# Patient Record
Sex: Male | Born: 1985 | Race: White | Hispanic: No | Marital: Married | Smoking: Current every day smoker
Health system: Southern US, Community
[De-identification: ages and names within clinical notes are randomized; demographics above are authoritative.]

## PROBLEM LIST (undated history)

## (undated) HISTORY — PX: TONSILLECTOMY: SUR1361

---

## 2009-07-28 ENCOUNTER — Ambulatory Visit: Payer: Self-pay | Admitting: Unknown Physician Specialty

## 2013-12-20 ENCOUNTER — Ambulatory Visit: Payer: Self-pay | Admitting: Family Medicine

## 2013-12-20 ENCOUNTER — Inpatient Hospital Stay: Payer: Self-pay | Admitting: Internal Medicine

## 2013-12-20 LAB — CBC WITH DIFFERENTIAL/PLATELET
Basophil #: 0.2 10*3/uL — ABNORMAL HIGH (ref 0.0–0.1)
Basophil %: 1.3 %
EOS ABS: 0.1 10*3/uL (ref 0.0–0.7)
Eosinophil %: 0.9 %
HCT: 46.3 % (ref 40.0–52.0)
HGB: 15.8 g/dL (ref 13.0–18.0)
LYMPHS ABS: 1.8 10*3/uL (ref 1.0–3.6)
LYMPHS PCT: 13.2 %
MCH: 30 pg (ref 26.0–34.0)
MCHC: 34.2 g/dL (ref 32.0–36.0)
MCV: 88 fL (ref 80–100)
MONO ABS: 0.6 x10 3/mm (ref 0.2–1.0)
Monocyte %: 4.1 %
NEUTROS ABS: 11.1 10*3/uL — AB (ref 1.4–6.5)
NEUTROS PCT: 80.5 %
PLATELETS: 189 10*3/uL (ref 150–440)
RBC: 5.28 10*6/uL (ref 4.40–5.90)
RDW: 13.4 % (ref 11.5–14.5)
WBC: 13.8 10*3/uL — ABNORMAL HIGH (ref 3.8–10.6)

## 2013-12-20 LAB — BASIC METABOLIC PANEL
Anion Gap: 14 (ref 7–16)
BUN: 9 mg/dL (ref 7–18)
CALCIUM: 9.1 mg/dL (ref 8.5–10.1)
CO2: 22 mmol/L (ref 21–32)
CREATININE: 1.09 mg/dL (ref 0.60–1.30)
Chloride: 100 mmol/L (ref 98–107)
EGFR (Non-African Amer.): >60
Glucose: 143 mg/dL — ABNORMAL HIGH (ref 65–99)
Osmolality: 273 (ref 275–301)
Potassium: 3.5 mmol/L (ref 3.5–5.1)
SODIUM: 136 mmol/L (ref 136–145)

## 2013-12-20 LAB — RAPID INFLUENZA A&B ANTIGENS (ARMC ONLY)

## 2013-12-21 LAB — BASIC METABOLIC PANEL
Anion Gap: 5 — ABNORMAL LOW (ref 7–16)
BUN: 8 mg/dL (ref 7–18)
CO2: 23 mmol/L (ref 21–32)
CREATININE: 0.74 mg/dL (ref 0.60–1.30)
Calcium, Total: 9.3 mg/dL (ref 8.5–10.1)
Chloride: 107 mmol/L (ref 98–107)
EGFR (Non-African Amer.): >60
GLUCOSE: 171 mg/dL — AB (ref 65–99)
Osmolality: 272 (ref 275–301)
POTASSIUM: 4.3 mmol/L (ref 3.5–5.1)
Sodium: 135 mmol/L — ABNORMAL LOW (ref 136–145)

## 2013-12-21 LAB — CBC WITH DIFFERENTIAL/PLATELET
BASOS ABS: 0.1 10*3/uL (ref 0.0–0.1)
Basophil %: 0.4 %
Eosinophil #: 0 10*3/uL (ref 0.0–0.7)
Eosinophil %: 0.1 %
HCT: 43 % (ref 40.0–52.0)
HGB: 14.6 g/dL (ref 13.0–18.0)
Lymphocyte #: 1.3 10*3/uL (ref 1.0–3.6)
Lymphocyte %: 10.6 %
MCH: 30.1 pg (ref 26.0–34.0)
MCHC: 34 g/dL (ref 32.0–36.0)
MCV: 89 fL (ref 80–100)
MONO ABS: 0.2 x10 3/mm (ref 0.2–1.0)
MONOS PCT: 1.6 %
Neutrophil #: 10.7 10*3/uL — ABNORMAL HIGH (ref 1.4–6.5)
Neutrophil %: 87.3 %
Platelet: 174 10*3/uL (ref 150–440)
RBC: 4.85 10*6/uL (ref 4.40–5.90)
RDW: 13.2 % (ref 11.5–14.5)
WBC: 12.2 10*3/uL — ABNORMAL HIGH (ref 3.8–10.6)

## 2013-12-25 LAB — CULTURE, BLOOD (SINGLE)

## 2015-03-05 NOTE — Discharge Summary (Signed)
PATIENT NAME:  Stanley Herrera, Stanley Herrera MR#:  [id number] DATE OF BIRTH:  August 21, 1986  DATE OF ADMISSION:  12/20/2013 DATE OF DISCHARGE:  12/21/2013  DISCHARGE DIAGNOSES: 1.  Acute respiratory failure. 2.  Chronic obstructive pulmonary disease exacerbation. 3.  Right upper lobe pneumonia with indistinct nodularity. Need to be followed by CT chest in 6 weeks. 4.  Active smoker.   CONDITION ON DISCHARGE: Stable.   CODE STATUS: FULL code.   MEDICATIONS ON DISCHARGE: 1.  Prednisone 10 mg tablet start at 60 mg daily and taper by 10 mg daily until complete.  2.  Advair 1 puff inhalation 2 times a day.  3.  Levaquin 500 mg oral tablet every 24 hours for 5 days.  4.  Chlorpheniramine and hydrocodone liquid 5 mL every 12 hours for 5 days as needed for cough.   DIET ON DISCHARGE: Regular. Diet consistency: Regular.   TIMEFRAME TO FOLLOW-UP: Within 4 to 6 weeks. Advised to follow in 6 weeks to have a repeat chest CT to compare.  HISTORY OF PRESENTING ILLNESS: A 29 year old male with history of tobacco abuse and past medication history of none who presented to Emergency Room with 4 to 5 days of progressive shortness of breath and cough and fever. In the Emergency Room, he was noted to be extremely tachypneic and labored breathing, was tachycardic and hypoxic also, and was given IV antibiotics and admitted to hospitalist service for further management.   HOSPITAL COURSE AND STAY: The patient was a chronic smoker, approximately 2 packs per day, but then now cutting down and was down at 1/2 pack per day. Was feeling much better on the next day after being treated by antibiotics, IV steroid, and nebulizer treatment. His acute respiratory failure, which was present on admission, requiring oxygen, he came off the next day home oxygen and was comfortable on room air. COPD exacerbation and pneumonia also responded very well within 1 day of treatment and so the patient was feeling fine, requested to let him go home, and he  would like to follow with a primary care for further issues, so we discharged him home.   OTHER MEDICAL ISSUES IN THIS HOSPITAL COURSE:  1.  There was finding of 2 cm indistinct nodule, worsening infiltrate, in right upper lobe on CT scan so we advised him to follow with a CT scan after 6 weeks, after clearing his pneumonia, with a primary care and he agreed with that.  2.  Active smoker. Was counseled for smoking cessation and he agreed to try nicotine inhaler.  IMPORTANT LABORATORY RESULTS IN THE HOSPITAL: On presentation, WBC was 13.8, hemoglobin was 15.8, and platelet count was 189. Influenza A and B were negative. Creatinine was 1.09, sodium 136, and potassium 3.5.   Chest x-ray, PA and lateral, was done which showed no acute cardiopulmonary disease. CT angiography was done to rule out pulmonary embolism, and it showed no evidence of acute pulmonary embolism, but ill-defined right upper lobe nodule with adjacent nodularity, most likely representing focal pneumonia. Followup CT advised in 4 to 6 weeks for clearance.   Blood culture was negative.  Chest x-ray, PA and lateral, on further followup showed bibasilar atelectasis.   TOTAL TIME SPENT ON THIS DISCHARGE: 40 minutes.   ____________________________ Hope PigeonVaibhavkumar G. Elisabeth PigeonVachhani, MD vgv:sb D: 01/05/2014 08:58:16 ET T: 01/05/2014 10:01:25 ET JOB#: [id number]  cc: Hope PigeonVaibhavkumar G. Elisabeth PigeonVachhani, MD, <Dictator> Duane LopeJeffrey D. Judithann SheenSparks, MD Altamese DillingVAIBHAVKUMAR Massiah Minjares MD ELECTRONICALLY SIGNED 01/10/2014 0:41

## 2015-03-05 NOTE — H&P (Signed)
PATIENT NAME:  Stanley ParadiseCOX, [name] MR#:  [id number] DATE OF BIRTH:  [birth year]  DATE OF ADMISSION:  12/20/2013  REFERRING PHYSICIAN: Dr. Cyril Herrera  FAMILY PHYSICIAN: None.   REASON FOR ADMISSION: Acute respiratory failure.   HISTORY OF PRESENT ILLNESS: The patient is a 29 year old male with a history of tobacco abuse, but other past medical history, who presents to the Emergency Room with a 4 to 5 day history of progressive shortness of breath, cough and fever. In the Emergency Room, the patient was noted to be extremely tachypneic, with labored breathing. He was also tachycardic and hypoxic. He was given fluids in the Emergency Room with IV antibiotics, with no improvement of his symptoms. He is now admitted for further evaluation.   PAST MEDICAL HISTORY: Tobacco abuse.   MEDICATIONS: None.   ALLERGIES: No known drug allergies.   SOCIAL HISTORY: The patient is married. He smokes 1/2 pack per day. No history of alcohol abuse. Works as a [employer] architect[employer]ruck driver.   FAMILY HISTORY: Positive for hypertension, but otherwise unremarkable.   REVIEW OF SYSTEMS:   CONSTITUTIONAL: The patient has had fever, but no change in weight.  EYES: No blurred or double vision. No glaucoma.  ENT: No tinnitus or hearing loss. No nasal discharge or bleeding. Some difficulty swallowing.  RESPIRATORY: The patient has had cough and wheezing. Denies hemoptysis.  CARDIOVASCULAR: No chest pain or orthopnea. Some palpitations. No syncope.  GASTROINTESTINAL: Nausea, but no vomiting or diarrhea. No abdominal pain.  GENITOURINARY: No dysuria or hematuria. No incontinence.  ENDOCRINE: No polyuria or polydipsia. No heat or cold intolerance.  HEMATOLOGIC: The patient denies anemia, easy bruising or bleeding.  LYMPHATIC: No swollen glands.  MUSCULOSKELETAL: The patient denies pain in his neck, back, shoulders, knees, or hips. No gout.  NEUROLOGIC: No numbness or migraines. Denies stroke or seizures.  PSYCHIATRIC: The patient denies anxiety,  insomnia or depression.   PHYSICAL EXAMINATION: GENERAL: The patient is acutely ill-appearing, in moderate respiratory distress.  VITAL SIGNS: Currently remarkable for a blood pressure of 126/77, heart rate of 132, respiratory rate of 22, temperature 100.2, sats 96% on oxygen.  HEENT: Normocephalic, atraumatic. Pupils equally round and reactive to light and accommodation. Extraocular movements are intact. Sclerae not icteric. Conjunctivae are clear.  Oropharynx is dry, but clear.  NECK: Supple, without JVD. No adenopathy or thyromegaly is noted.  LUNGS: Revealed expiratory wheezes with scattered rhonchi and dullness along the right mid lung field. Respiratory effort is increased.  CARDIAC: Exam reveals a rapid rate with a regular rhythm. Normal S1 and S2. No significant rubs, murmurs or gallops. PMI is nondisplaced. Chest wall is nontender.  ABDOMEN: Soft, nontender, with normoactive bowel sounds. No organomegaly or masses were appreciated. No hernias or bruits were noted.  EXTREMITIES: Without clubbing, cyanosis or edema. Pulses were 2+ bilaterally.  SKIN: Warm and dry, without rash or lesions.  NEUROLOGIC: Exam revealed cranial nerves II through XII grossly intact. Deep tendon reflexes were symmetric. Motor and sensory exams nonfocal.  PSYCHIATRIC: Exam revealed a patient who is alert and oriented to person, place, and time. He was cooperative and used good judgment.   LABORATORY DATA: EKG revealed sinus tachycardia with no acute ischemic changes. CT of the chest revealed a nodular infiltrate consistent with pneumonia in the right upper lobe. Influenza swab was negative. White count was 13.8, with a hemoglobin of 15.8. Glucose 143, with a BUN of 9, creatinine 1.09, with a sodium of 136, potassium of 3.5.   ASSESSMENT: 1.  Acute respiratory  failure.  2.  Chronic obstructive pulmonary disease exacerbation.  3.  Pneumonia.  4.  Tachycardia.   PLAN: The patient will be admitted to the floor as  a FULL CODE with oxygen, IV steroids, IV antibiotics, and DuoNeb SVNs. Will follow his sugars while on steroids. Will begin empiric Symbicort and Mucinex. Will also begin IV fluids to see if this improves his tachycardia. Followup chest x-ray and labs in the morning. Wean oxygen as tolerated. Will send sputum off for Gram stain and culture. Further treatment and evaluation will depend upon the patient's progress.   Total time spent on this patient was 45 minutes.    ____________________________ Stanley Herrera Judithann Sheen, MD jds:mr D: 12/20/2013 18:55:16 ET T: 12/20/2013 20:24:46 ET JOB#: [id number]  cc: Stanley Herrera. Judithann Sheen, MD, <Dictator> Stanley Tonkovich Rodena Medin MD ELECTRONICALLY SIGNED 12/21/2013 7:48

## 2015-10-08 IMAGING — CT CT ANGIO CHEST
2 of 6 series · 18 of 36 positions shown · IV contrast (APPLIED)
Comparison: DG CHEST 2V dated 12/20/2013

CLINICAL DATA: Long distance truck driver with shortness of breath.

EXAM:
CT ANGIOGRAPHY CHEST WITH CONTRAST
TECHNIQUE: Multidetector CT imaging of the chest was performed using the
standard protocol during bolus administration of intravenous
contrast. Multiplanar CT image reconstructions including MIPs were
obtained to evaluate the vascular anatomy.
CONTRAST:  100 ml Isovue 370.

[Series 5: pe 1.0 thins · axial · 0.78mm/px · z∈[-742,-466]mm · 17 of 311 slices shown]
[im 18/311  lung]
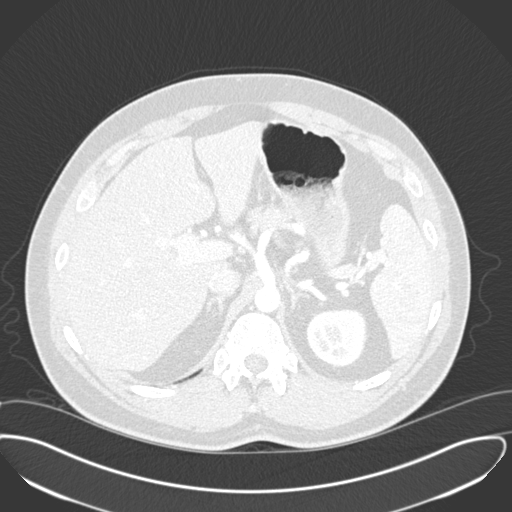
[im 35/311  mediastinal]
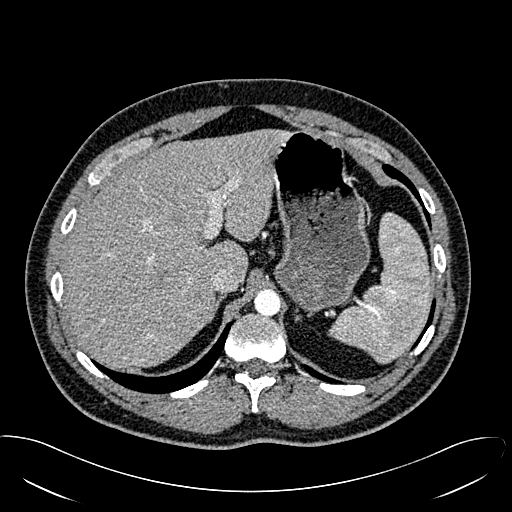
[im 52/311  lung]
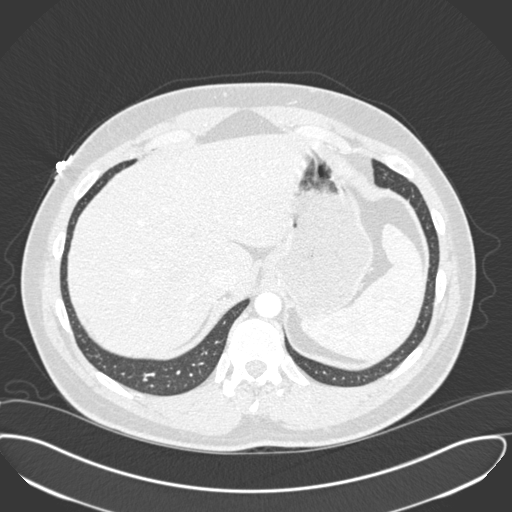
[im 69/311  mediastinal]
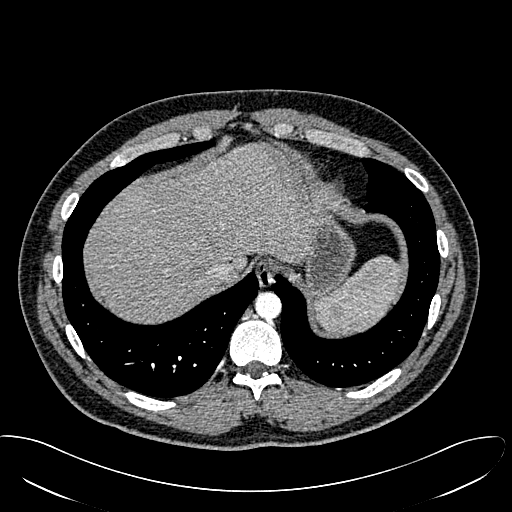
[im 87/311  lung]
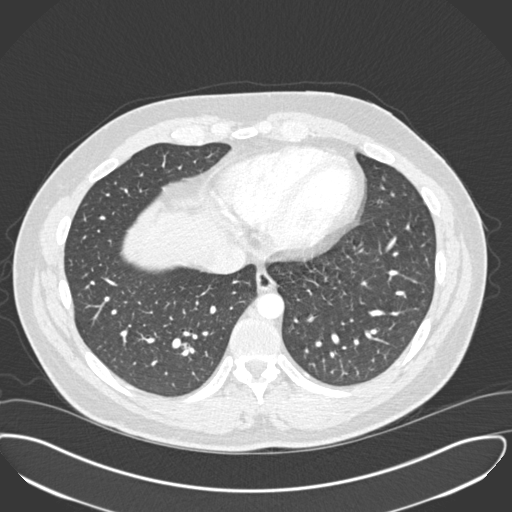
[im 104/311  mediastinal]
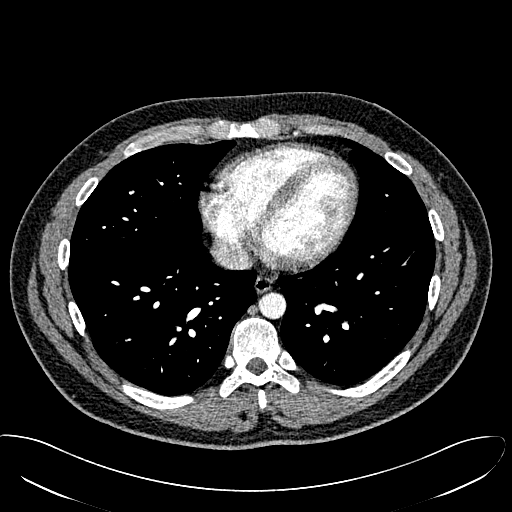
[im 121/311  lung]
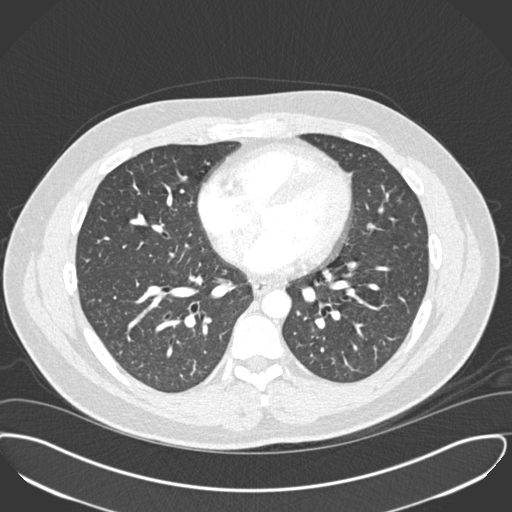
[im 138/311  mediastinal]
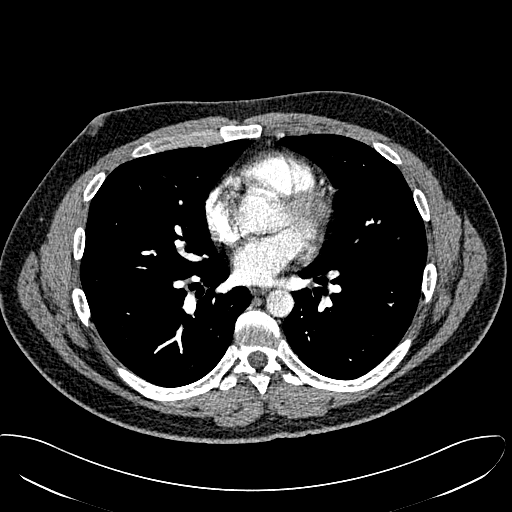
[im 156/311  lung]
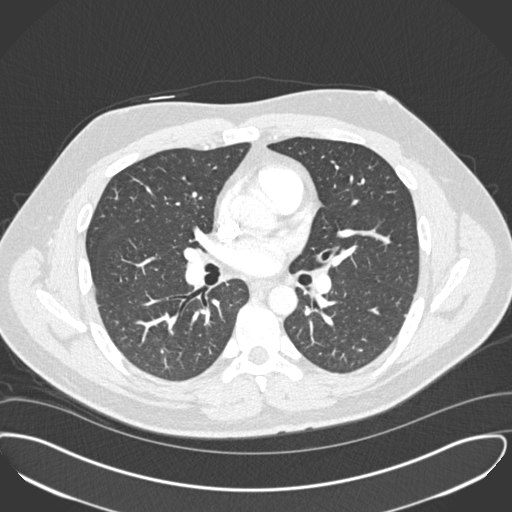
[im 173/311  mediastinal]
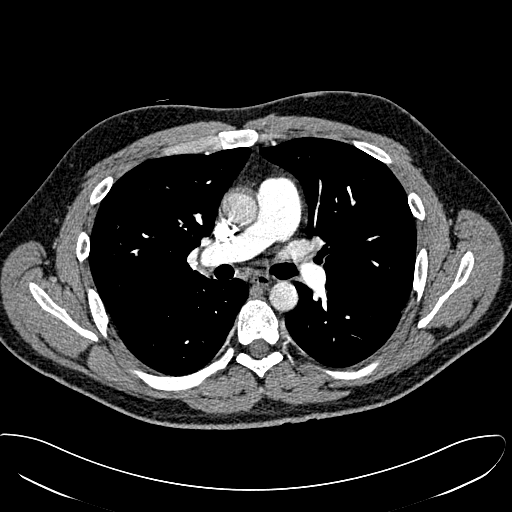
[im 190/311  lung]
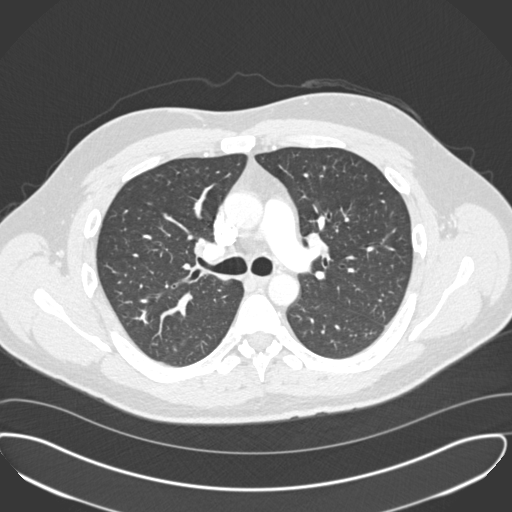
[im 207/311  mediastinal]
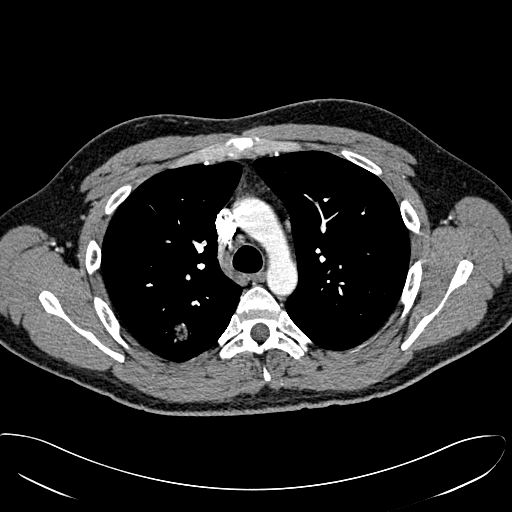
[im 224/311  lung]
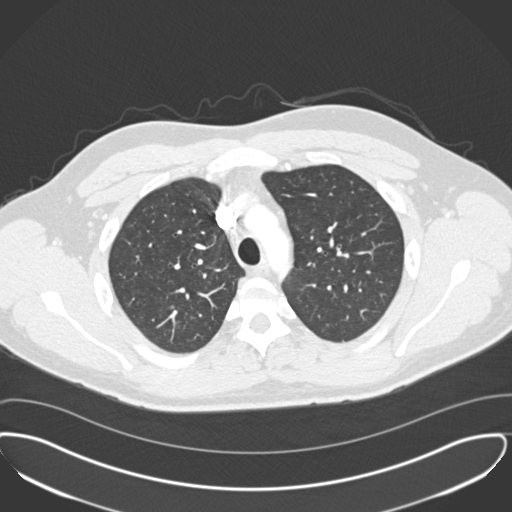
[im 242/311  mediastinal]
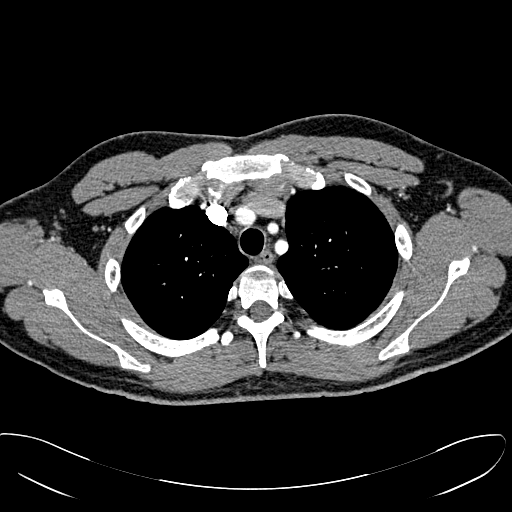
[im 259/311  lung]
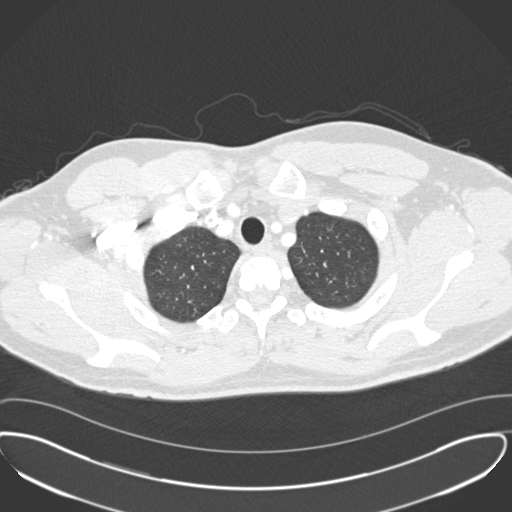
[im 276/311  mediastinal]
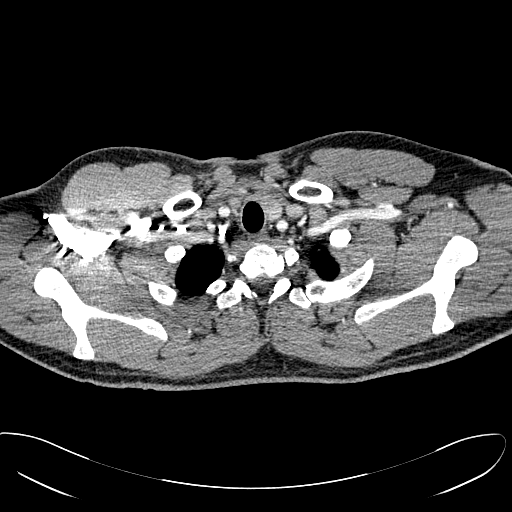
[im 293/311  lung]
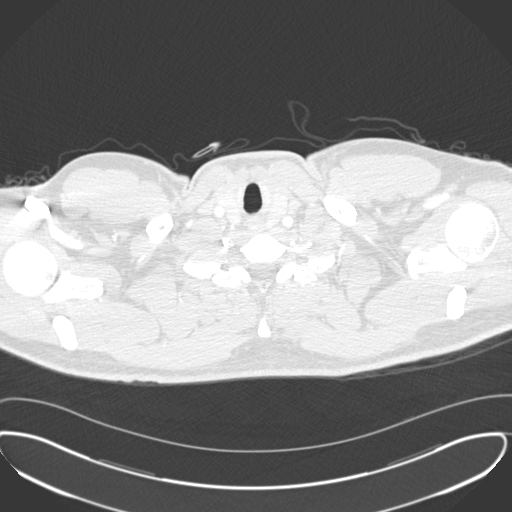

[Series 7: cor pe 2.0 mpr · coronal · 0.59mm/px · 1 of 143 slices shown]
[im 72/143  mediastinal]
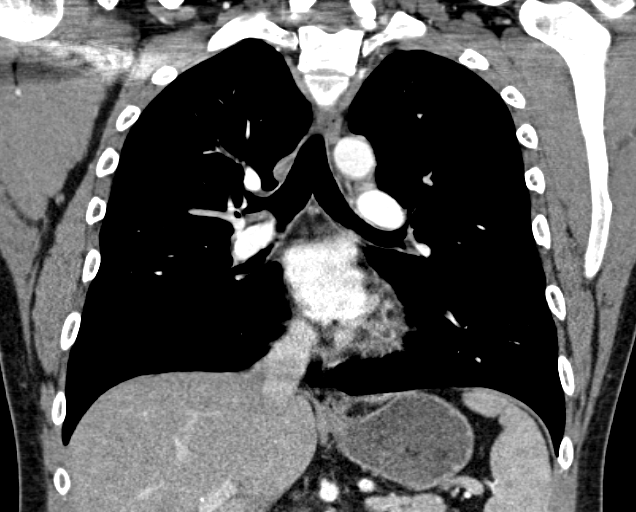

[18 of 36 positions shown; findings below may reference images not displayed]

FINDINGS: The pulmonary arteries are well opacified with contrast. There is no
evidence of acute pulmonary embolism.

There is no significant atherosclerosis. Small hilar lymph nodes
bilaterally are likely reactive. There is a 9 mm pretracheal node on
image 56. No pathologically enlarged mediastinal or hilar lymph
nodes are demonstrated. There is no pleural or pericardial effusion.

There is an irregular nodular density posteriorly in the right upper
lobe, measuring 2.5 cm on image 52. Although potentially an
irregular pulmonary nodule, this appears less distinct on the
reformatted images and is favored to reflect a focal infiltrate.
This is supported by the presence of additional areas of surrounding
ill-defined nodularity. The left lung is clear.

The visualized upper abdomen appears unremarkable. There are no
worrisome osseous findings.

Review of the MIP images confirms the above findings.
IMPRESSION: 1. No evidence of acute pulmonary embolism.
2. Somewhat ill-defined right upper lobe nodule with adjacent
nodularity most likely represents focal pneumonia in this
27-year-old. This is not well visualized radiographically.
Therefore, this may require limited CT follow-up in 4-6 weeks (after
appropriate therapy) to exclude an underlying neoplastic process.
3. Probable small reactive mediastinal and hilar lymph nodes. No
significant pleural effusion.

## 2015-10-09 IMAGING — CR DG CHEST 2V
1 series · 2 of 2 positions shown · non-contrast
Comparison: CT ANGIO CHEST dated 12/20/2013; DG CHEST 2V dated
12/20/2013

CLINICAL DATA: Pneumonia.

EXAM:
CHEST - 2 VIEW

[Series 1: w chest pa · 0.14mm/px · 2 of 2 slices shown]
[im 1/2]
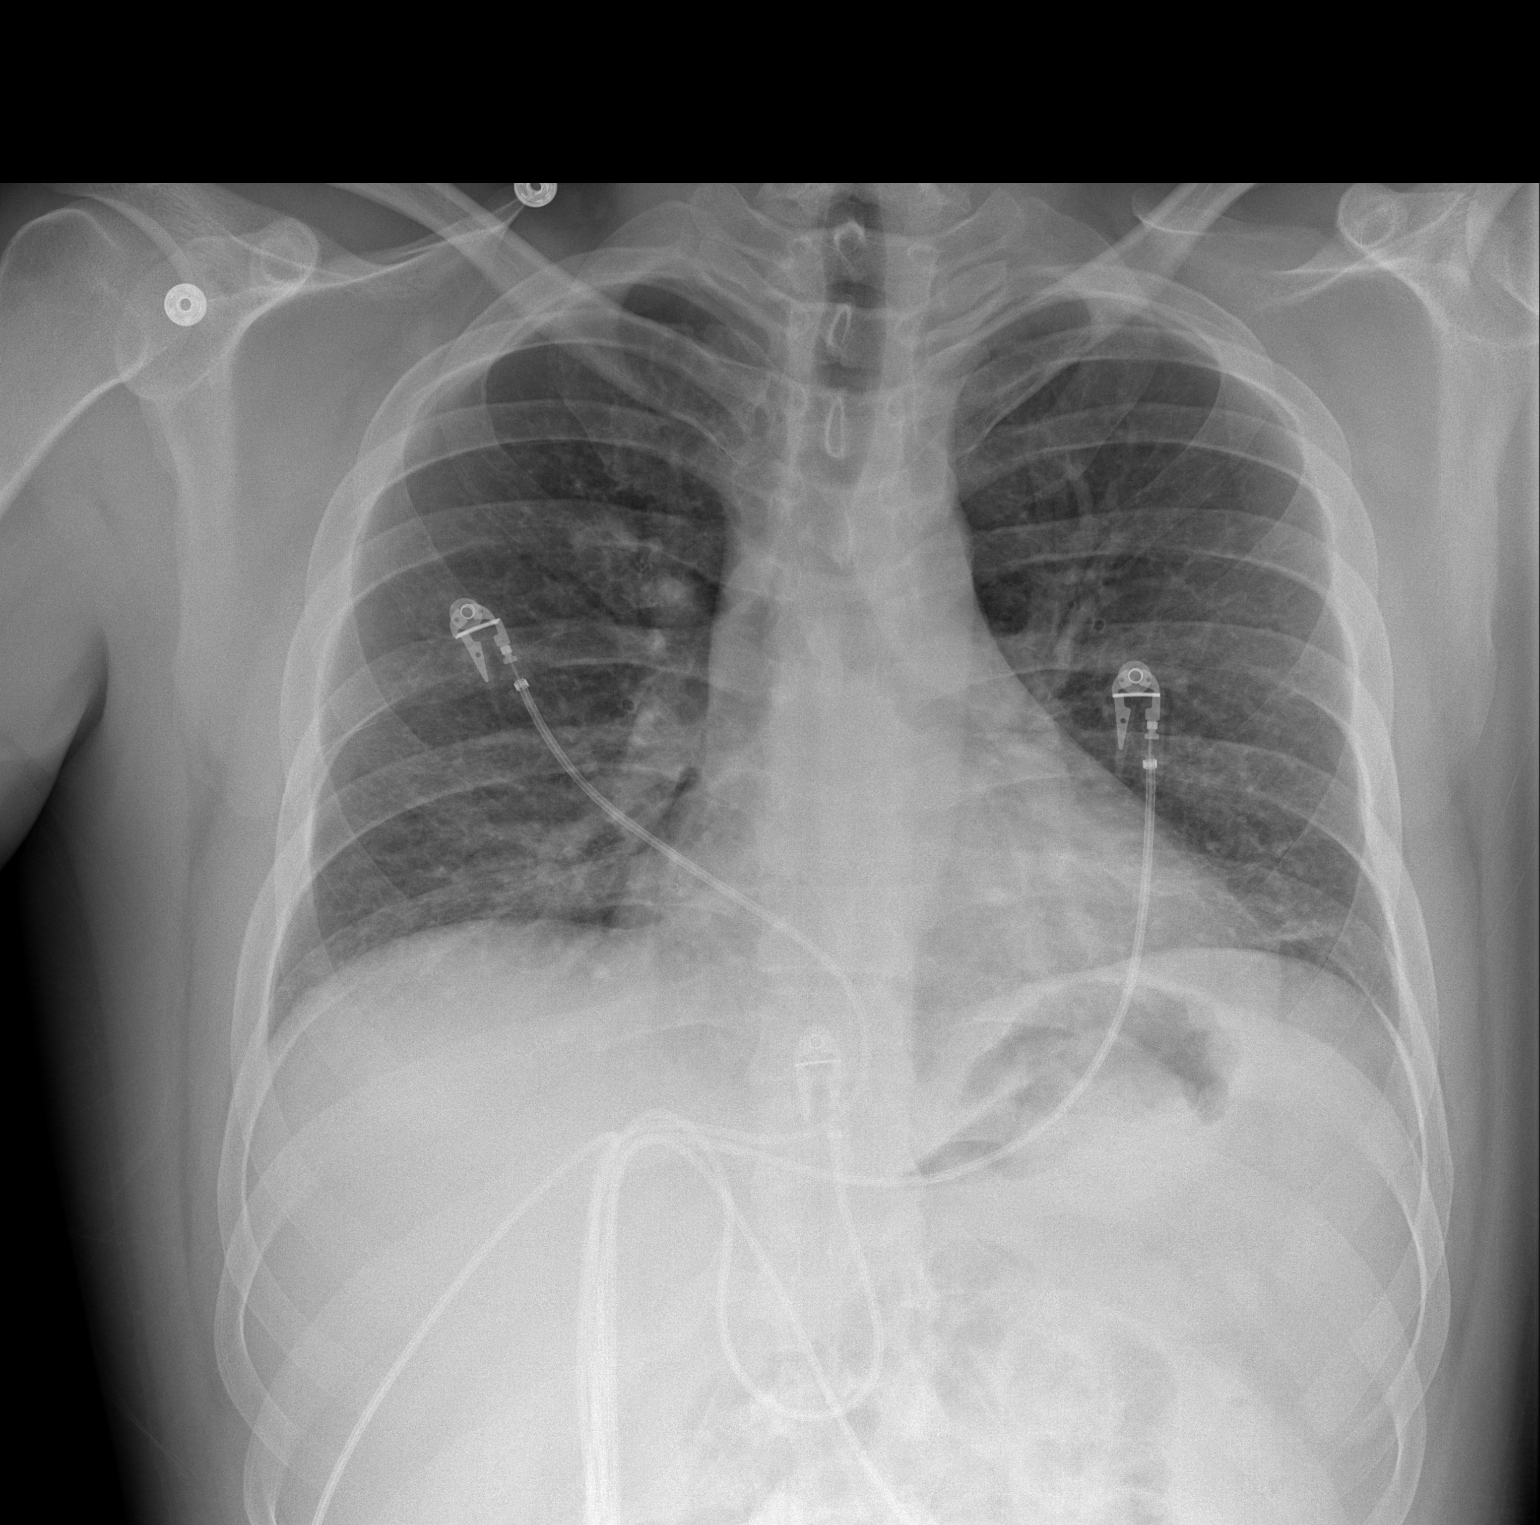
[im 2/2]
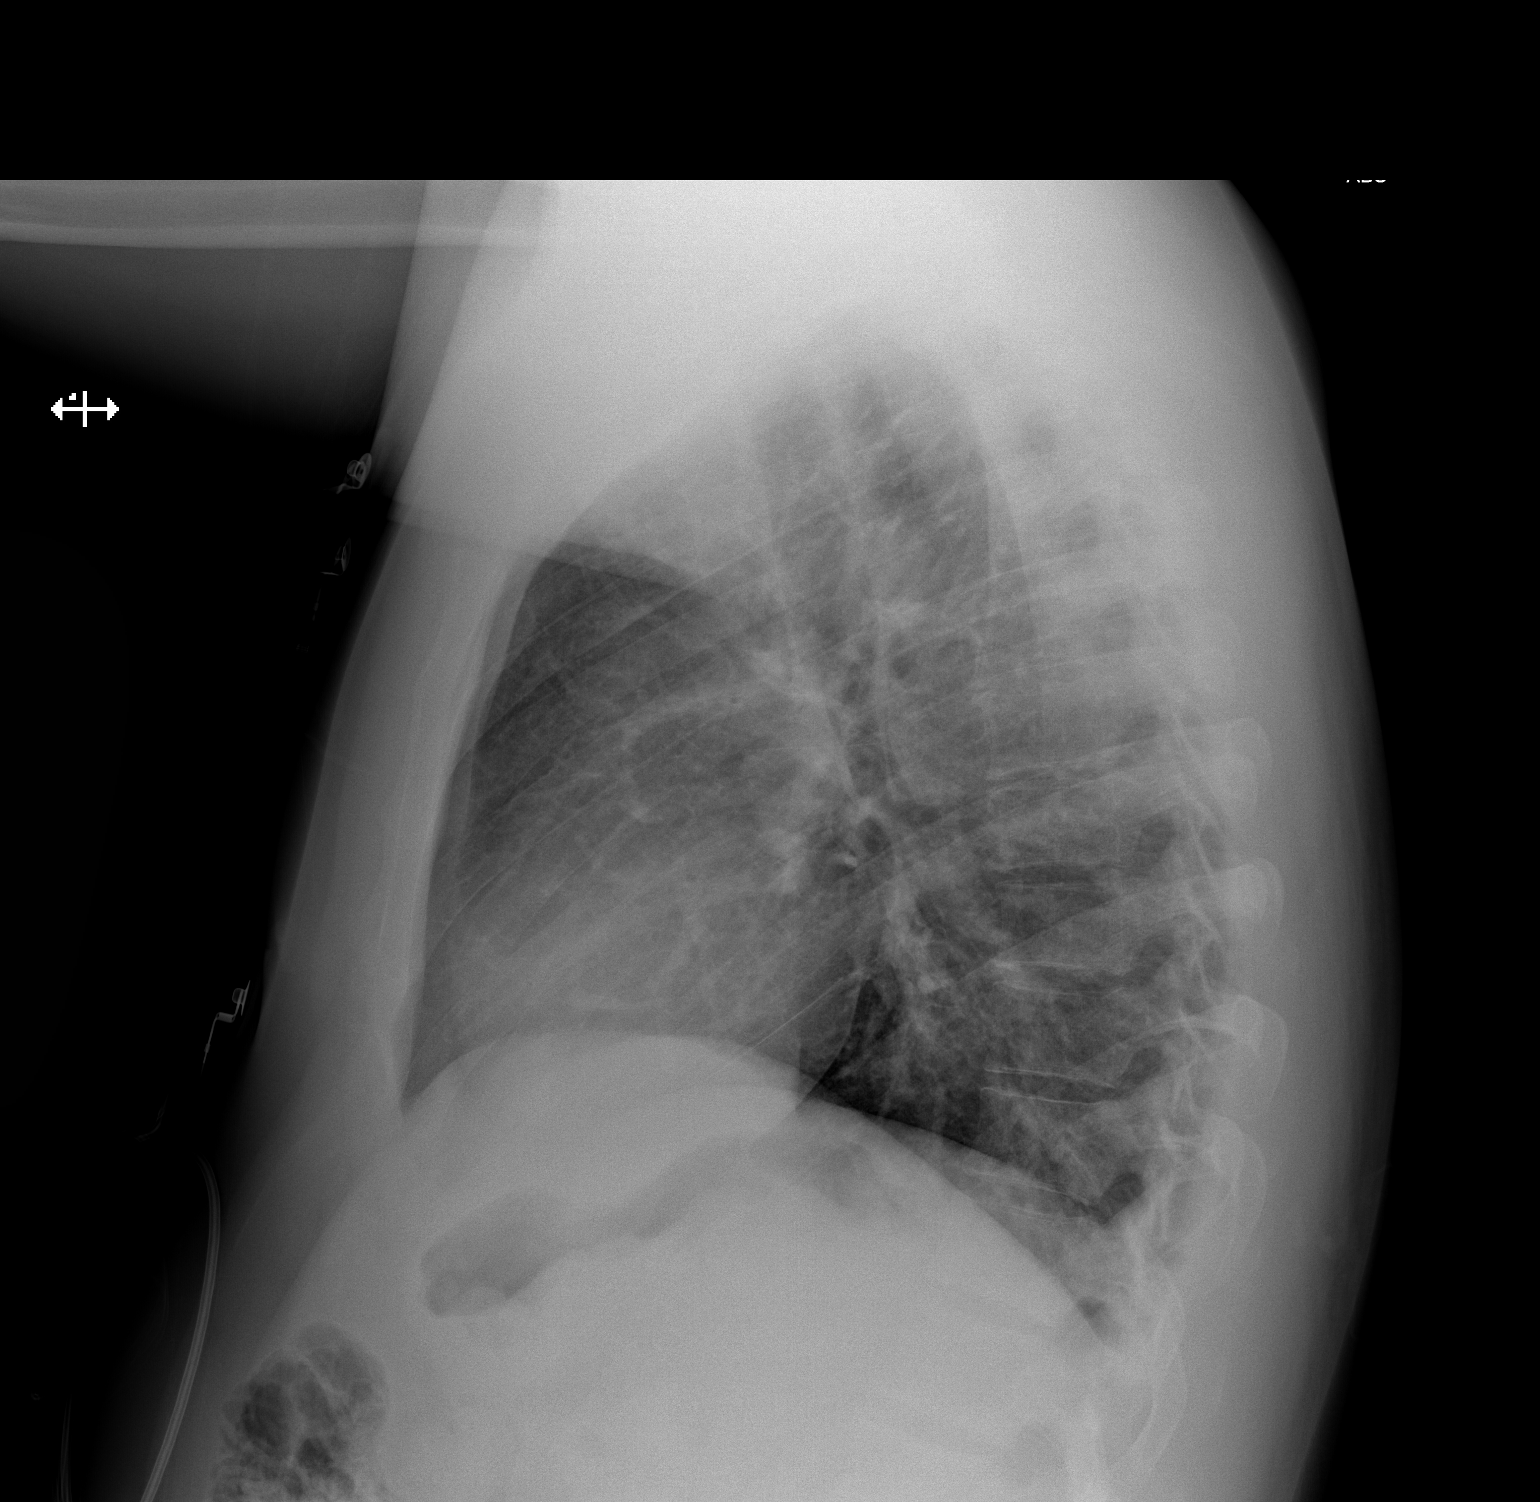

[2 of 2 positions shown; findings below may reference images not displayed]

FINDINGS: The heart size and mediastinal contours are within normal limits.
Bibasilar atelectasis present. There is no evidence of progressive
infiltrate. No edema or pleural fluid is identified. The visualized
skeletal structures are unremarkable.
IMPRESSION: Bibasilar atelectasis.

## 2015-11-22 ENCOUNTER — Ambulatory Visit
Admission: EM | Admit: 2015-11-22 | Discharge: 2015-11-22 | Disposition: A | Discharge status: Home / Self Care | Payer: Self-pay | Attending: Family Medicine | Admitting: Family Medicine

## 2015-11-22 ENCOUNTER — Encounter: Payer: Self-pay | Admitting: Emergency Medicine

## 2015-11-22 DIAGNOSIS — Z024 Encounter for examination for driving license: Secondary | ICD-10-CM

## 2015-11-22 DIAGNOSIS — Z029 Encounter for administrative examinations, unspecified: Secondary | ICD-10-CM

## 2015-11-22 LAB — DEPT OF TRANSP DIPSTICK, URINE (ARMC ONLY)
GLUCOSE, UA: NEGATIVE mg/dL
Hgb urine dipstick: NEGATIVE
PROTEIN: NEGATIVE mg/dL
SPECIFIC GRAVITY, URINE: 1.02 (ref 1.005–1.030)

## 2015-11-22 NOTE — ED Provider Notes (Signed)
CSN: [id number]     Arrival date & time 11/22/15  1348 History   First MD Initiated Contact with Patient 11/22/15 1604     Chief Complaint  Patient presents with  . DOT Physical    (Consider location/radiation/quality/duration/timing/severity/associated sxs/prior Treatment) HPI  30 year old male presents for a DOT physical. She has no medical problems and has not had any surgeries. He is currently not taking any medications.  History reviewed. No pertinent past medical history. Past Surgical History  Procedure Laterality Date  . Tonsillectomy     History reviewed. No pertinent family history. Social History  Substance Use Topics  . Smoking status: Current Every Day Smoker -- 1.00 packs/day    Types: Cigarettes  . Smokeless tobacco: Never Used  . Alcohol Use: Yes    Review of Systems  All other systems reviewed and are negative.   Allergies  Review of patient's allergies indicates no known allergies.  Home Medications   Prior to Admission medications   Not on File   Meds Ordered and Administered this Visit  Medications - No data to display  BP 114/75 mmHg  Pulse 65  Temp(Src) 97 F (36.1 C) (Tympanic)  Resp 16  Ht 5' 8.5" (1.74 m)  Wt 189 lb (85.73 kg)  BMI 28.32 kg/m2  SpO2 98% No data found.   Physical Exam  Constitutional:  Referred to DOT physical  Nursing note and vitals reviewed.   ED Course  Procedures (including critical care time)  Labs Review Labs Reviewed  DEPT OF TRANSP DIPSTICK, URINE([facility] ONLY)    Imaging Review No results found.   Visual Acuity Review  Right Eye Distance: 20/20 uncorrected Left Eye Distance: 20/20 uncorrected Bilateral Distance: 20/20 uncorrected  Right Eye Near:   Left Eye Near:    Bilateral Near:         MDM   1. Driver's permit physical examination        Lutricia FeilWilliam P Crew Goren, PA-C 11/22/15 1643

## 2015-11-22 NOTE — ED Notes (Signed)
Patient here for DOT Physical.
